# Patient Record
Sex: Female | Born: 2000 | Race: White | Hispanic: No | Marital: Single | Smoking: Never smoker
Health system: Southern US, Community
[De-identification: ages and names within clinical notes are randomized; demographics above are authoritative.]

## PROBLEM LIST (undated history)

## (undated) DIAGNOSIS — Z789 Other specified health status: Secondary | ICD-10-CM

## (undated) DIAGNOSIS — T1592XA Foreign body on external eye, part unspecified, left eye, initial encounter: Secondary | ICD-10-CM

## (undated) HISTORY — DX: Foreign body on external eye, part unspecified, left eye, initial encounter: T15.92XA

## (undated) HISTORY — DX: Other specified health status: Z78.9

## (undated) HISTORY — PX: TYMPANOSTOMY TUBE PLACEMENT: SHX32

---
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
## 2021-02-03 ENCOUNTER — Ambulatory Visit: Payer: BC Managed Care – PPO | Admitting: Sports Medicine

## 2021-02-03 VITALS — BP 100/60 | Ht 63.5 in | Wt 128.0 lb

## 2021-02-03 DIAGNOSIS — M7660 Achilles tendinitis, unspecified leg: Secondary | ICD-10-CM

## 2021-02-03 NOTE — Patient Instructions (Addendum)
It was great to see you!  Your achilles pain is from achilles tendinopathy. Fortunately, this typically improves with proper exercises (called Alfredson rehab protocol). Do these exercises (3 sets of 15) daily until your pain improves. Then continue to perform the exercises twice per week as maintenance.  You should also use a heel lift in your shoe to reduce strain/stretching of the achilles.  You can continue running as long as your pain is not becoming worse. Be sure to ice after you run.   Take care, Dr. Anner Crete & Dr. Margaretha Sheffield

## 2021-02-03 NOTE — Progress Notes (Signed)
° °  Subjective:    Patient ID: Rachel Gamble, female    DOB: [birth year], 20 y.o.   MRN: [id number]  HPI Patient presents with bilateral achilles pain. Pain is equal on both sides and located in mid achilles (maybe slightly distal achilles, patient is unsure). Symptoms started approximately 2 months ago but were mild. Pain has become worse over the past 2 weeks. She notices the pain is worse when walking barefoot and when she first wakes up in the morning. Also hurts at the beginning of a run but then subsides throughout the run and she has no pain afterwards. It is not painful to the touch. She occasionally feels/hears a pop but the pop is not painful.  Has not tried any medications or other things for relief. Symptoms do not interfere with her normal activity.  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 No change in her activity level, terrain, or type of training recently.     Objective:   Physical Exam Gen: alert, well-appearing, comfortably Bilateral Feet/Ankles: Inspection:  No obvious bony deformity b/l.  No swelling, erythema, or bruising b/l. Pes planus noted. Palpation: No tenderness to palpation throughout entire foot, no TTP of bilateral malleoli. No tenderness of achilles tendon. ROM: Full  ROM of the ankle and foot b/l.  Strength: 5/5 ankle strength in all planes b/l Neurovascular: N/V intact distally in the lower extremity b/l Special tests: Plantar flexion with Thompson's test     Assessment & Plan:   Bilateral Achilles Tendinopathy Rachel Gamble is a 20 y.o. female runner who presents with 2 months of bilateral achilles pain. Exam is largely unremarkable today without significant achilles tenderness and no ROM or strength deficits. Presentation consistent with bilateral achilles tendinopathy. Recommended Alfredson rehab exercises and heel lift. Could consider formal physical therapy if no improvement.  Maury Dus, MD PGY-2  [facility]  Patient seen and evaluated with the resident.  I agree with the above plan of care.  Treatment as above for bilateral Achilles tendinopathy.  She may also seek treatment in the training room at her college.  If symptoms persist, she will notify me and we will order physical therapy to be done where she is in school.  Otherwise, follow-up as needed.

## 2021-06-22 ENCOUNTER — Telehealth: Payer: Self-pay | Admitting: Pediatrics

## 2021-06-22 NOTE — Telephone Encounter (Signed)
Mother called requesting to speak with [name]. Mother states that the patient will be going on a trip to a remote location. Her professor is requesting students to bring Ciprofloxacin oral tablets with them. Mother is inquiring if she can have a prescription sent in to the [facility] before she leaves for her trip. Mother states patient will be in town until Saturday. Mother was made aware that [name] is out of office but will be returning tomorrow.  ? ? ?Rachel Gamble ?[phone] ? ?[name] ?[phone] ?

## 2021-06-23 MED ORDER — CIPROFLOXACIN HCL 500 MG PO TABS: 500.0000 mg | ORAL_TABLET | Freq: Two times a day (BID) | ORAL | 0 refills | Status: AC

## 2021-06-23 NOTE — Telephone Encounter (Signed)
Prescription for ciprofloxacin oral tablets sent to [facility].  ? ?Left generic voice message on both mom and [place] mobile phone numbers.   ?

## 2022-07-25 ENCOUNTER — Ambulatory Visit: Payer: BC Managed Care – PPO | Admitting: Podiatry

## 2022-07-25 DIAGNOSIS — S90222A Contusion of left lesser toe(s) with damage to nail, initial encounter: Secondary | ICD-10-CM | POA: Diagnosis not present

## 2022-07-25 DIAGNOSIS — L84 Corns and callosities: Secondary | ICD-10-CM | POA: Diagnosis not present

## 2022-07-25 DIAGNOSIS — R61 Generalized hyperhidrosis: Secondary | ICD-10-CM | POA: Diagnosis not present

## 2022-07-25 DIAGNOSIS — L603 Nail dystrophy: Secondary | ICD-10-CM | POA: Diagnosis not present

## 2022-07-25 NOTE — Patient Instructions (Signed)

## 2022-07-27 DIAGNOSIS — R61 Generalized hyperhidrosis: Secondary | ICD-10-CM | POA: Insufficient documentation

## 2022-07-27 DIAGNOSIS — L603 Nail dystrophy: Secondary | ICD-10-CM | POA: Insufficient documentation

## 2022-07-27 NOTE — Progress Notes (Signed)
       Chief Complaint  Patient presents with   Nail Problem    RM 11 Bilateral 3rd and 4th ingrown pain. Pt states no matter how she cuts her nails her nail tend to grow outward and into her nail bed. Soreness with pressure. No drainage or bleeding. Pt also complains of nail fungus of left 3rd toe and partial nail detachment.     HPI: 22 y.o. female presenting today with concern of thickened toenails.  She notes that they do not grow like normal toenails do.  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  She can often run on uneven terrain.  She has a discolored left third toenail.  She also notes that she has issues with sweaty feet.  Past Medical History:  Diagnosis Date   Foreign body of left eye 10/15/2014   No pertinent past medical history     Past Surgical History:  Procedure Laterality Date   TYMPANOSTOMY TUBE PLACEMENT      Allergies  Allergen Reactions   Cefdinir Rash     Physical Exam:  General: The patient is alert and oriented x3 in no acute distress.  Dermatology: Skin is warm, moist with perspiration, and supple bilateral lower extremities. Interspaces are clear of maceration and debris.  There is a thin amount of hyperkeratosis on the distal lateral aspect of the bilateral fourth toe.  There is pain on palpation.  There is no erythema or soft tissue breakdown.  No blister formation.  There is a subungual hematoma which is dry and stable but has stained the underside of the left third toenail.  Left third toenail is loose without pain.  Toenails are thickened and dystrophic bilateral  Vascular: Palpable pedal pulses bilaterally. Capillary refill within normal limits.  No appreciable edema.  No erythema or calor.  Neurological: Light touch sensation grossly intact bilateral feet.   Musculoskeletal Exam: No pedal deformities noted  Assessment/Plan of Care: 1. Hyperhidrosis   2. Subungual hematoma of toenail of left foot, initial  encounter   3. Onychodystrophy   4. Corn of toe     Discussed clinical findings with patient today.  The dystrophic nails were trimmed and smoothed.  The distal fourth toe corns were shaved with a sterile #313 blade.  She was fitted for mini-sized Silipos gel toe caps for the fourth toes the left third toenail was gently cut back to the eponychium uneventfully.  The remaining nailbed is stable.  Recommended Zeasorb powder and/or Certain Dri antiperspirant wipes to see if this helps with the hyperhidrosis of the feet.  Follow-up as needed   Clerance Lav, DPM, FACFAS [facility]     [address] [place], [place] [address]                Office [phone]  Fax [phone]
